# Patient Record
Sex: Male | Born: 2000 | Race: White | Hispanic: Yes | Marital: Single | Smoking: Never smoker
Health system: Southern US, Community
[De-identification: ages and names within clinical notes are randomized; demographics above are authoritative.]

## PROBLEM LIST (undated history)

## (undated) DIAGNOSIS — F909 Attention-deficit hyperactivity disorder, unspecified type: Secondary | ICD-10-CM

---
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
## 2021-09-22 IMAGING — DX DG CLAVICLE*L*
2 series · 2 of 2 positions shown · non-contrast
Comparison: None.

CLINICAL DATA: Status post fall

EXAM:
LEFT CLAVICLE - 2+ VIEWS

[w clavicle ap left]
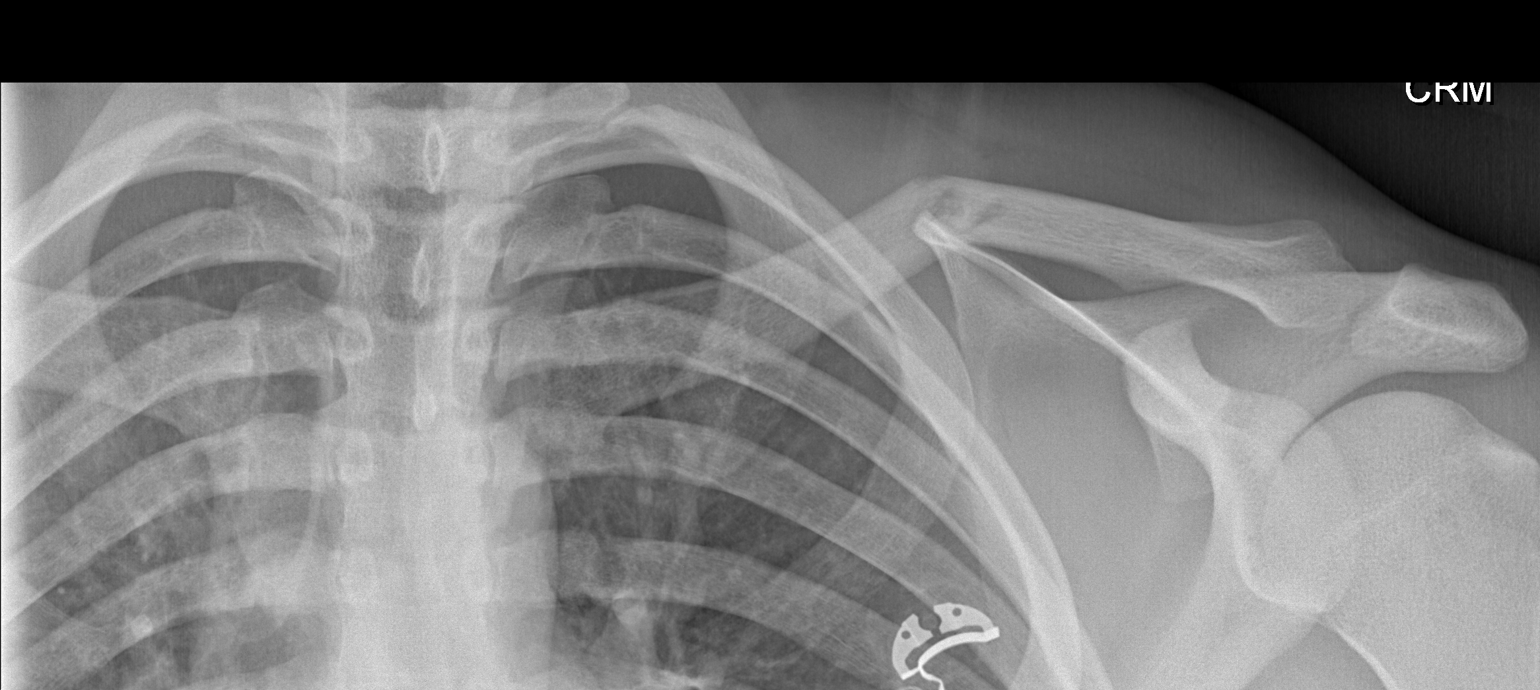

[w clavicle tangential left]
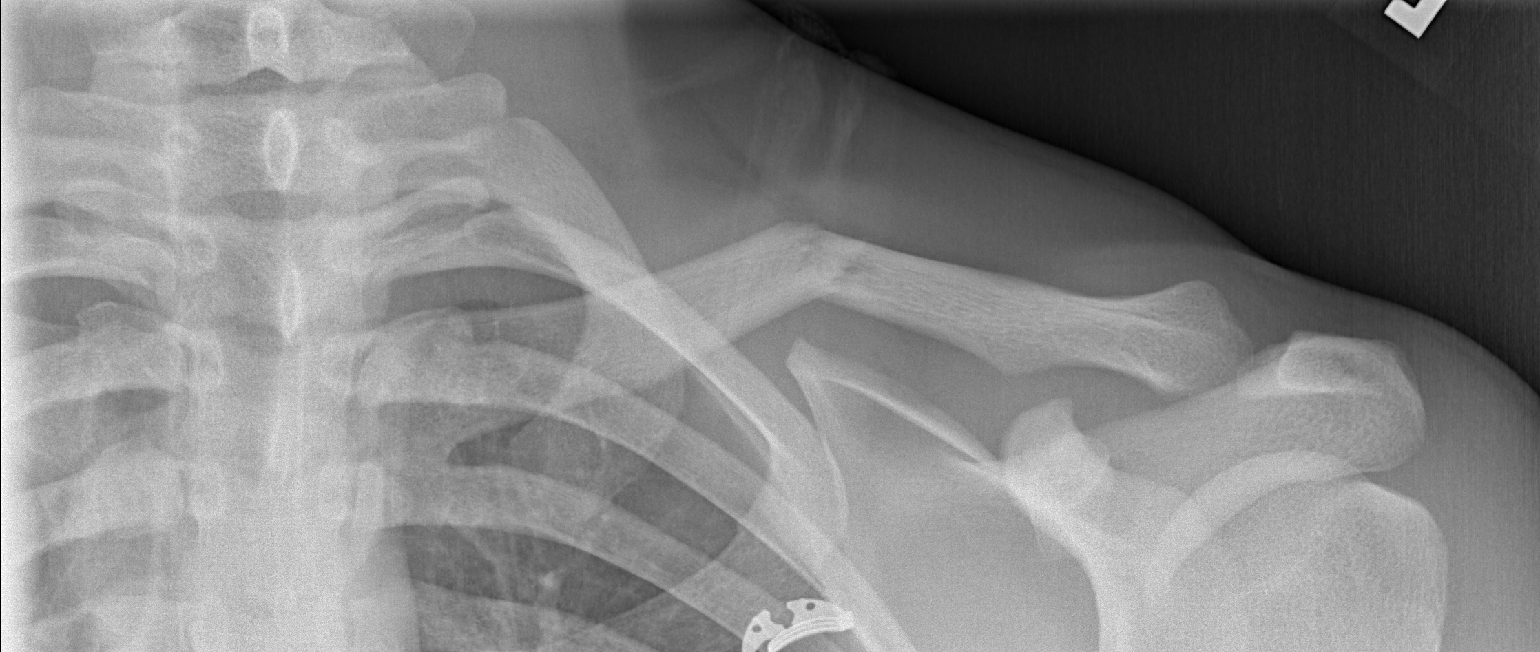

[2 of 2 positions shown; findings below may reference images not displayed]

FINDINGS: Acute nondisplaced fracture of the left mid clavicle with apex
cephalad angulation. No other fracture or dislocation.
Acromioclavicular joint is normal.
IMPRESSION: Acute nondisplaced fracture of the left mid clavicle with apex
cephalad angulation.
